# Patient Record
Sex: Male | Born: 1965 | Race: White | Hispanic: No | Marital: Married | Smoking: Never smoker
Health system: Southern US, Community
[De-identification: ages and names within clinical notes are randomized; demographics above are authoritative.]

## PROBLEM LIST (undated history)

## (undated) DIAGNOSIS — I2699 Other pulmonary embolism without acute cor pulmonale: Secondary | ICD-10-CM

## (undated) DIAGNOSIS — I82409 Acute embolism and thrombosis of unspecified deep veins of unspecified lower extremity: Secondary | ICD-10-CM

## (undated) DIAGNOSIS — B2790 Infectious mononucleosis, unspecified without complication: Secondary | ICD-10-CM

## (undated) DIAGNOSIS — I10 Essential (primary) hypertension: Secondary | ICD-10-CM

## (undated) HISTORY — DX: Acute embolism and thrombosis of unspecified deep veins of unspecified lower extremity: I82.409

## (undated) HISTORY — PX: TONSILLECTOMY: SUR1361

## (undated) HISTORY — DX: Infectious mononucleosis, unspecified without complication: B27.90

## (undated) HISTORY — DX: Other pulmonary embolism without acute cor pulmonale: I26.99

---
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
## 1997-01-24 ENCOUNTER — Encounter: Payer: Self-pay | Admitting: Cardiovascular Disease

## 2000-02-29 ENCOUNTER — Encounter: Payer: Self-pay | Admitting: Cardiovascular Disease

## 2001-05-23 ENCOUNTER — Encounter: Admission: RE | Admit: 2001-05-23 | Discharge: 2001-05-23 | Payer: Self-pay | Admitting: Family Medicine

## 2001-05-23 ENCOUNTER — Encounter: Payer: Self-pay | Admitting: Family Medicine

## 2008-04-14 ENCOUNTER — Encounter: Payer: Self-pay | Admitting: Cardiovascular Disease

## 2009-01-01 ENCOUNTER — Encounter: Admission: RE | Admit: 2009-01-01 | Discharge: 2009-01-01 | Payer: Self-pay | Admitting: Family Medicine

## 2010-01-31 IMAGING — CR DG CHEST 2V
2 series · 2 of 2 positions shown · non-contrast
Comparison: None

CLINICAL DATA: Chest pain

CHEST - 2 VIEW

[w chest pa]
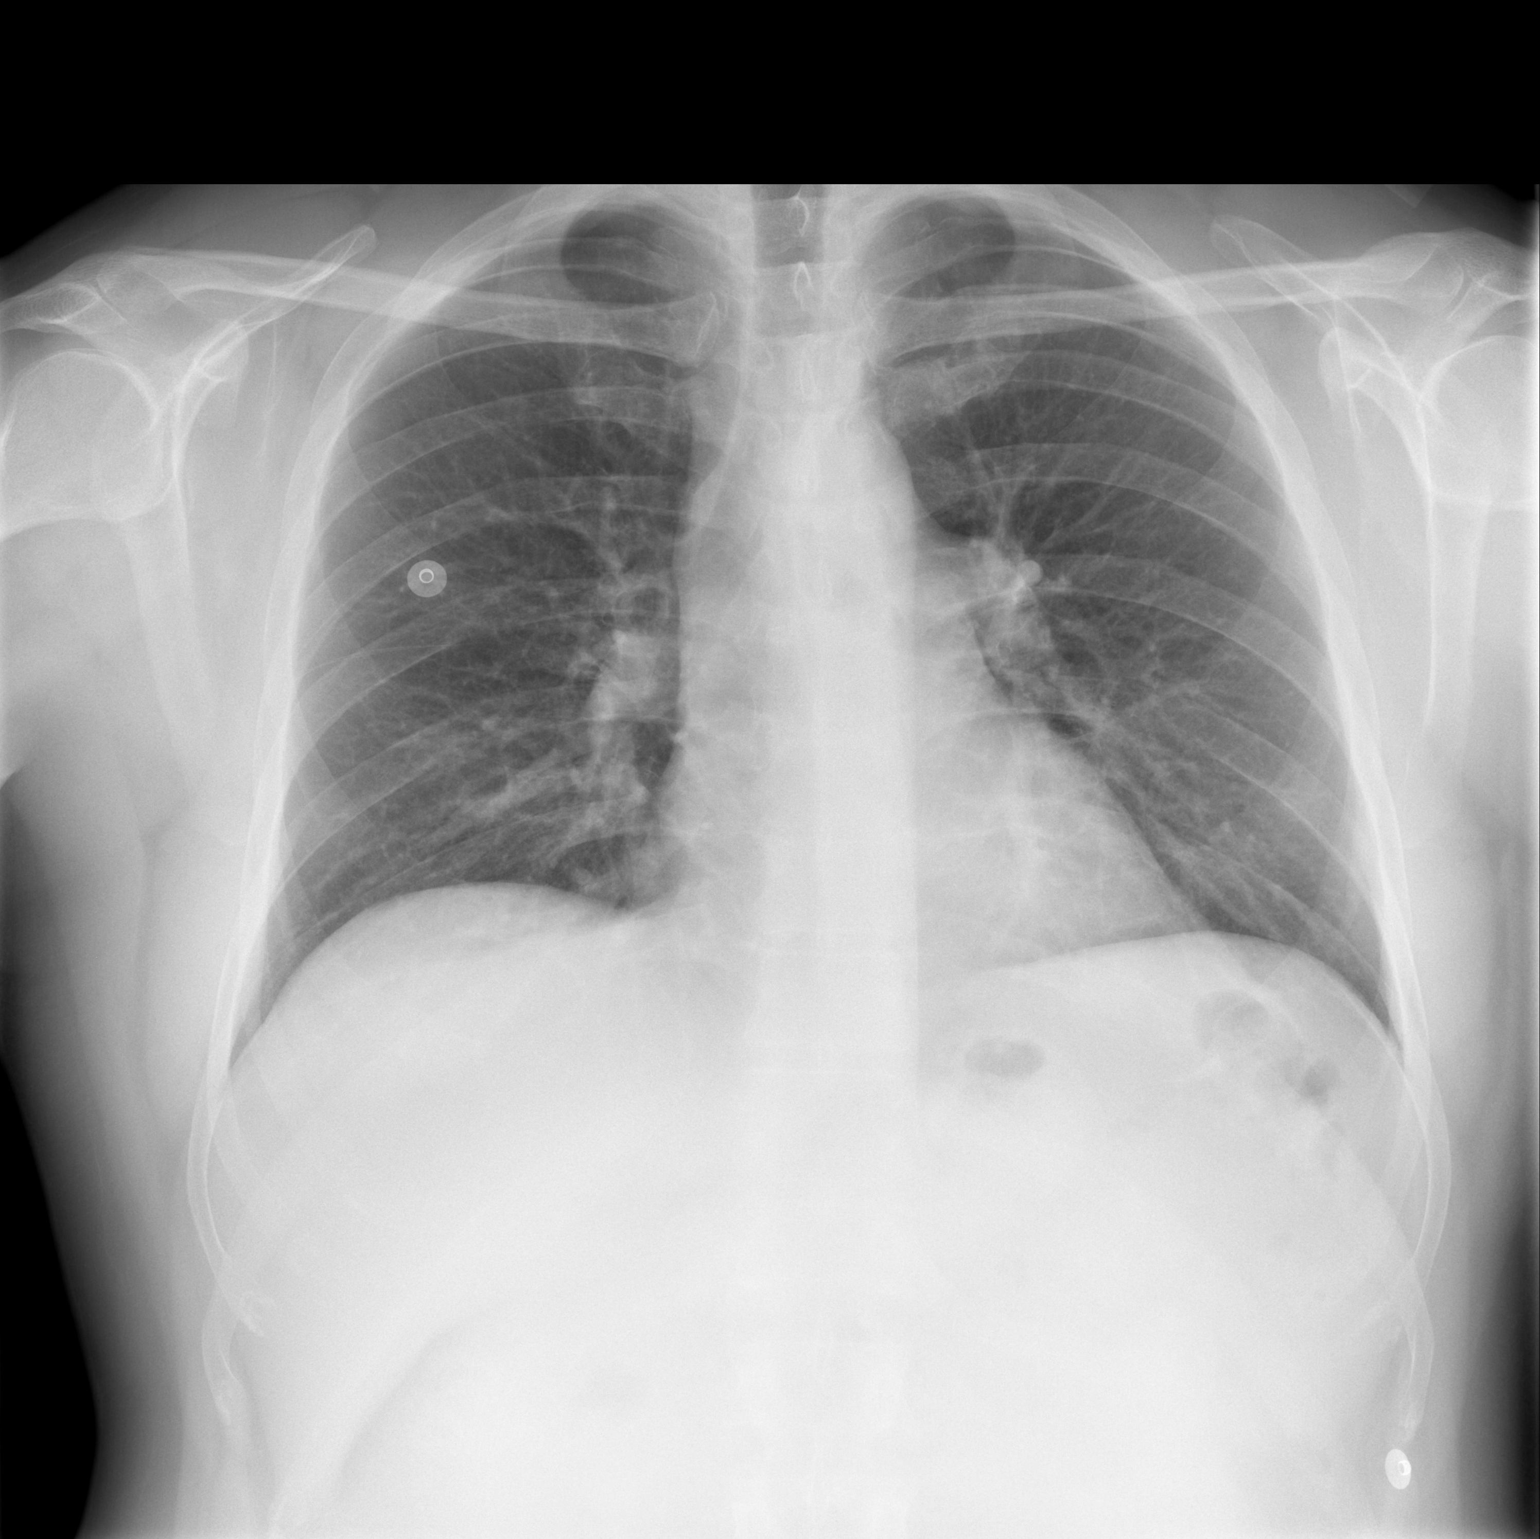

[w chest lat]
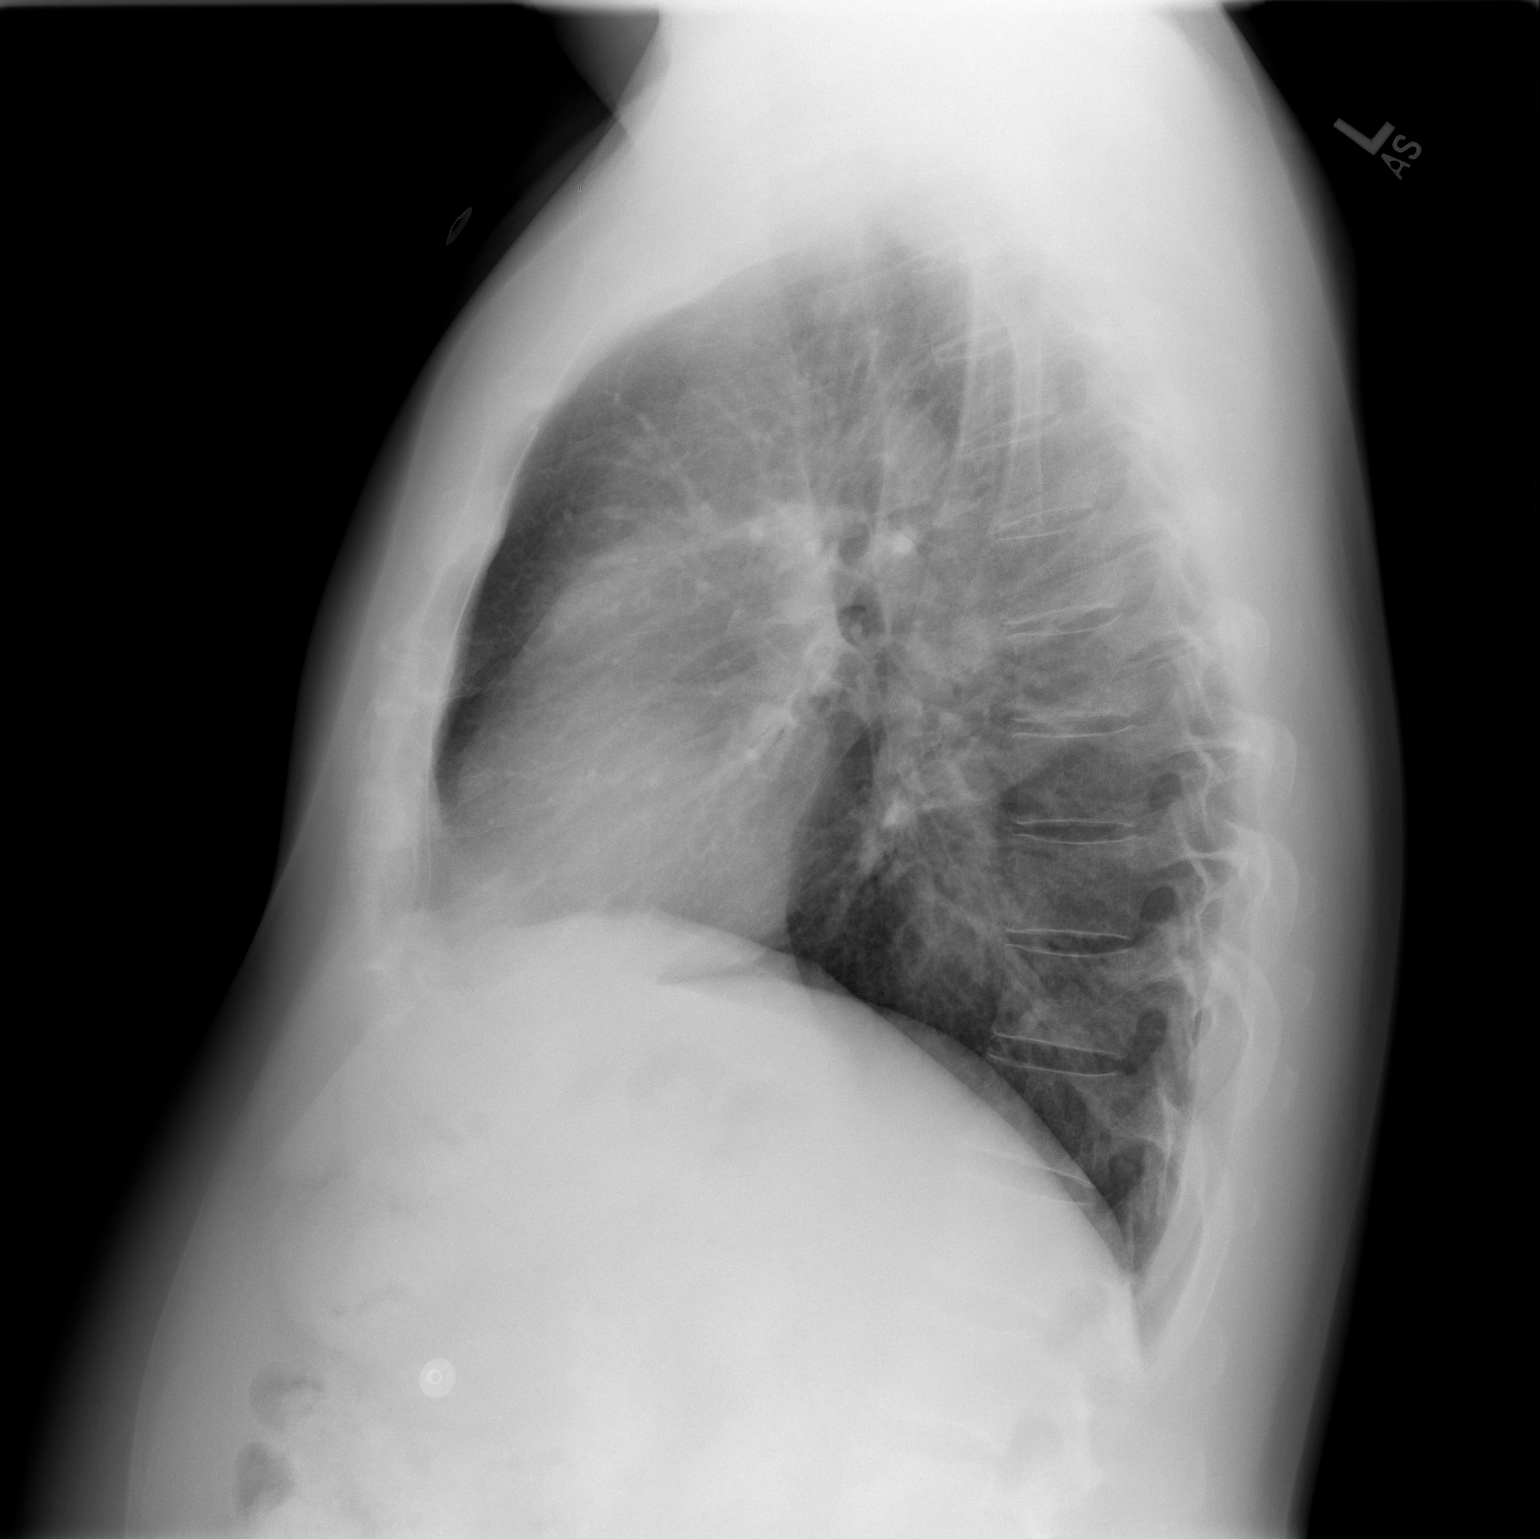

[2 of 2 positions shown; findings below may reference images not displayed]

FINDINGS: Lungs clear.  Heart size and pulmonary vascularity
normal.  No effusion.  Visualized bones unremarkable.
IMPRESSION: No acute disease

## 2012-04-13 ENCOUNTER — Encounter: Payer: Self-pay | Admitting: *Deleted

## 2016-03-08 DIAGNOSIS — Z0000 Encounter for general adult medical examination without abnormal findings: Secondary | ICD-10-CM | POA: Diagnosis not present

## 2016-03-08 DIAGNOSIS — Z125 Encounter for screening for malignant neoplasm of prostate: Secondary | ICD-10-CM | POA: Diagnosis not present

## 2016-03-08 DIAGNOSIS — E7800 Pure hypercholesterolemia, unspecified: Secondary | ICD-10-CM | POA: Diagnosis not present

## 2016-04-12 DIAGNOSIS — K080 Exfoliation of teeth due to systemic causes: Secondary | ICD-10-CM | POA: Diagnosis not present

## 2016-04-25 DIAGNOSIS — J069 Acute upper respiratory infection, unspecified: Secondary | ICD-10-CM | POA: Diagnosis not present

## 2016-09-12 DIAGNOSIS — Z23 Encounter for immunization: Secondary | ICD-10-CM | POA: Diagnosis not present

## 2016-09-12 DIAGNOSIS — E7800 Pure hypercholesterolemia, unspecified: Secondary | ICD-10-CM | POA: Diagnosis not present

## 2016-09-12 DIAGNOSIS — I10 Essential (primary) hypertension: Secondary | ICD-10-CM | POA: Diagnosis not present

## 2016-10-18 DIAGNOSIS — K080 Exfoliation of teeth due to systemic causes: Secondary | ICD-10-CM | POA: Diagnosis not present

## 2016-11-18 DIAGNOSIS — K080 Exfoliation of teeth due to systemic causes: Secondary | ICD-10-CM | POA: Diagnosis not present

## 2017-02-23 DIAGNOSIS — J069 Acute upper respiratory infection, unspecified: Secondary | ICD-10-CM | POA: Diagnosis not present

## 2017-07-24 DIAGNOSIS — I10 Essential (primary) hypertension: Secondary | ICD-10-CM | POA: Diagnosis not present

## 2017-07-24 DIAGNOSIS — Z0000 Encounter for general adult medical examination without abnormal findings: Secondary | ICD-10-CM | POA: Diagnosis not present

## 2017-07-24 DIAGNOSIS — Z125 Encounter for screening for malignant neoplasm of prostate: Secondary | ICD-10-CM | POA: Diagnosis not present

## 2017-07-24 DIAGNOSIS — E7800 Pure hypercholesterolemia, unspecified: Secondary | ICD-10-CM | POA: Diagnosis not present

## 2017-09-19 DIAGNOSIS — K080 Exfoliation of teeth due to systemic causes: Secondary | ICD-10-CM | POA: Diagnosis not present

## 2017-10-24 DIAGNOSIS — K080 Exfoliation of teeth due to systemic causes: Secondary | ICD-10-CM | POA: Diagnosis not present

## 2018-03-27 DIAGNOSIS — K080 Exfoliation of teeth due to systemic causes: Secondary | ICD-10-CM | POA: Diagnosis not present

## 2018-06-20 DIAGNOSIS — I10 Essential (primary) hypertension: Secondary | ICD-10-CM | POA: Diagnosis not present

## 2018-06-20 DIAGNOSIS — Z1159 Encounter for screening for other viral diseases: Secondary | ICD-10-CM | POA: Diagnosis not present

## 2018-06-20 DIAGNOSIS — E7800 Pure hypercholesterolemia, unspecified: Secondary | ICD-10-CM | POA: Diagnosis not present

## 2018-06-20 DIAGNOSIS — R0789 Other chest pain: Secondary | ICD-10-CM | POA: Diagnosis not present

## 2018-08-05 ENCOUNTER — Emergency Department (HOSPITAL_COMMUNITY): Payer: Federal, State, Local not specified - PPO

## 2018-08-05 ENCOUNTER — Emergency Department (HOSPITAL_COMMUNITY)
Admission: EM | Admit: 2018-08-05 | Discharge: 2018-08-05 | Disposition: A | Discharge status: Home / Self Care | Payer: Federal, State, Local not specified - PPO | Attending: Emergency Medicine | Admitting: Emergency Medicine

## 2018-08-05 ENCOUNTER — Encounter (HOSPITAL_COMMUNITY): Payer: Self-pay | Admitting: Emergency Medicine

## 2018-08-05 DIAGNOSIS — Z86718 Personal history of other venous thrombosis and embolism: Secondary | ICD-10-CM | POA: Diagnosis not present

## 2018-08-05 DIAGNOSIS — Z79899 Other long term (current) drug therapy: Secondary | ICD-10-CM | POA: Insufficient documentation

## 2018-08-05 DIAGNOSIS — I10 Essential (primary) hypertension: Secondary | ICD-10-CM | POA: Insufficient documentation

## 2018-08-05 DIAGNOSIS — Z7982 Long term (current) use of aspirin: Secondary | ICD-10-CM | POA: Diagnosis not present

## 2018-08-05 DIAGNOSIS — N23 Unspecified renal colic: Secondary | ICD-10-CM | POA: Diagnosis not present

## 2018-08-05 DIAGNOSIS — N201 Calculus of ureter: Secondary | ICD-10-CM

## 2018-08-05 DIAGNOSIS — R109 Unspecified abdominal pain: Secondary | ICD-10-CM | POA: Diagnosis not present

## 2018-08-05 HISTORY — DX: Essential (primary) hypertension: I10

## 2018-08-05 LAB — URINALYSIS, ROUTINE W REFLEX MICROSCOPIC
BACTERIA UA: NONE SEEN
BILIRUBIN URINE: NEGATIVE
Glucose, UA: NEGATIVE mg/dL
Ketones, ur: NEGATIVE mg/dL
Leukocytes, UA: NEGATIVE
NITRITE: NEGATIVE
PROTEIN: NEGATIVE mg/dL
SPECIFIC GRAVITY, URINE: 1.005 (ref 1.005–1.030)
pH: 6 (ref 5.0–8.0)

## 2018-08-05 LAB — CBC WITH DIFFERENTIAL/PLATELET
Abs Immature Granulocytes: 0 10*3/uL (ref 0.0–0.1)
Basophils Absolute: 0 10*3/uL (ref 0.0–0.1)
Basophils Relative: 1 %
EOS PCT: 4 %
Eosinophils Absolute: 0.2 10*3/uL (ref 0.0–0.7)
HEMATOCRIT: 47.9 % (ref 39.0–52.0)
HEMOGLOBIN: 16.2 g/dL (ref 13.0–17.0)
Immature Granulocytes: 1 %
LYMPHS ABS: 2.8 10*3/uL (ref 0.7–4.0)
LYMPHS PCT: 42 %
MCH: 30.7 pg (ref 26.0–34.0)
MCHC: 33.8 g/dL (ref 30.0–36.0)
MCV: 90.7 fL (ref 78.0–100.0)
MONO ABS: 0.6 10*3/uL (ref 0.1–1.0)
MONOS PCT: 10 %
Neutro Abs: 2.9 10*3/uL (ref 1.7–7.7)
Neutrophils Relative %: 44 %
Platelets: 240 10*3/uL (ref 150–400)
RBC: 5.28 MIL/uL (ref 4.22–5.81)
RDW: 12.2 % (ref 11.5–15.5)
WBC: 6.7 10*3/uL (ref 4.0–10.5)

## 2018-08-05 LAB — COMPREHENSIVE METABOLIC PANEL
ALBUMIN: 4.1 g/dL (ref 3.5–5.0)
ALT: 21 U/L (ref 0–44)
ANION GAP: 10 (ref 5–15)
AST: 23 U/L (ref 15–41)
Alkaline Phosphatase: 82 U/L (ref 38–126)
BILIRUBIN TOTAL: 1.6 mg/dL — AB (ref 0.3–1.2)
BUN: 9 mg/dL (ref 6–20)
CHLORIDE: 102 mmol/L (ref 98–111)
CO2: 25 mmol/L (ref 22–32)
Calcium: 9 mg/dL (ref 8.9–10.3)
Creatinine, Ser: 1.14 mg/dL (ref 0.61–1.24)
GFR calc Af Amer: >60 mL/min (ref >60)
GFR calc non Af Amer: >60 mL/min (ref >60)
GLUCOSE: 109 mg/dL — AB (ref 70–99)
POTASSIUM: 3.9 mmol/L (ref 3.5–5.1)
SODIUM: 137 mmol/L (ref 135–145)
Total Protein: 6.9 g/dL (ref 6.5–8.1)

## 2018-08-05 MED ORDER — ONDANSETRON 4 MG PO TBDP: 4.0000 mg | ORAL_TABLET | Freq: Three times a day (TID) | ORAL | 0 refills | Status: AC | PRN

## 2018-08-05 MED ORDER — SODIUM CHLORIDE 0.9 % IV BOLUS
1000.0000 mL | Freq: Once | INTRAVENOUS | Status: AC
  Administered 2018-08-05: 1000 mL via INTRAVENOUS

## 2018-08-05 MED ORDER — ONDANSETRON HCL 4 MG/2ML IJ SOLN
4.0000 mg | Freq: Once | INTRAMUSCULAR | Status: AC
  Administered 2018-08-05: 4 mg via INTRAVENOUS
  Filled 2018-08-05: qty 2

## 2018-08-05 MED ORDER — HYDROCODONE-ACETAMINOPHEN 5-325 MG PO TABS: 1.0000 | ORAL_TABLET | Freq: Four times a day (QID) | ORAL | 0 refills | Status: AC | PRN

## 2018-08-05 MED ORDER — KETOROLAC TROMETHAMINE 30 MG/ML IJ SOLN
15.0000 mg | Freq: Once | INTRAMUSCULAR | Status: AC
  Administered 2018-08-05: 15 mg via INTRAVENOUS
  Filled 2018-08-05: qty 1

## 2018-08-05 MED ORDER — TAMSULOSIN HCL 0.4 MG PO CAPS: 0.4000 mg | ORAL_CAPSULE | Freq: Every day | ORAL | 0 refills | Status: AC

## 2018-08-05 NOTE — ED Triage Notes (Signed)
Pt states he was sitting in bed last night and had sudden onset right flank pain. The pain radiates down to the right LQ abdomen. Denies urinary symptoms.

## 2018-08-05 NOTE — ED Notes (Signed)
Patient transported to CT 

## 2018-08-05 NOTE — ED Notes (Signed)
Patient ambulatory to bathroom with steady gait at this time 

## 2018-08-05 NOTE — Discharge Instructions (Signed)
°  Kidney Stone There is evidence of a kidney stone on the right side.  It appears as though it is on its way out.  Some kidney stones can take up to 30 days to pass. Hydration: Hydration is key to helping a kidney stone pass.  Have a goal of half a liter of water every hour or two. Antiinflammatory medications: Take 600 mg of ibuprofen every 6 hours or 440 mg (over the counter dose) to 500 mg (prescription dose) of naproxen every 12 hours for the next 3 days. After this time, these medications may be used as needed for pain. Take these medications with food to avoid upset stomach. Choose only one of these medications, do not take them together. Tylenol: Should you continue to have additional pain while taking the ibuprofen or naproxen, you may add in tylenol as needed. Your daily total maximum amount of tylenol from all sources should be limited to 4000mg /day for persons without liver problems, or 2000mg /day for those with liver problems. Vicodin: May take Vicodin (hydrocodone-acetaminophen) as needed for severe pain.  Do not drive or perform other dangerous activities while taking the Vicodin.  Please note that each pill of Vicodin contains 325 mg of acetaminophen (Tylenol) and the above dosage limits apply. Tamsulosin: This medication is designed to help the stone pass.  Take this medication daily until stone passes. Zofran: Use of Zofran, as needed, for nausea/vomiting. Follow-up: Follow-up with the urologist as soon as possible on this matter.  Please also note there was an incidental finding of a nodule located in the right lower pelvis.  Recommendation is for repeat CT of the abdomen/pelvis in 3 months.  This may be set up through a primary care provider or urology.

## 2018-08-05 NOTE — ED Notes (Signed)
Patient verbalizes understanding of discharge instructions. Opportunity for questioning and answers were provided. Armband removed by staff, pt discharged from ED.  

## 2018-08-05 NOTE — ED Provider Notes (Signed)
[facility] EMERGENCY DEPARTMENT Provider Note   CSN: [id number] Arrival date & time: 08/05/18  1027     History   Chief Complaint Chief Complaint  Patient presents with  . Flank Pain    HPI Bob Hendrix is a 52 y.o. male.  HPI   RESHARD KITTELSON is a 52 y.o. male, with a history of DVT, HTN, and PE, presenting to the ED with right lower back and flank pain beginning around 8 AM this morning.  Pain feels like a squeezing, waxes and wanes between 5/10 to 7/10, radiating around the right flank into the right lower quadrant.  He has not had this pain before.  Accompanied by nausea.  Denies fever, vomiting, diarrhea, hematochezia/melena, other abdominal pain, genital pain, hematuria, dysuria, redness of breath, chest pain, numbness/weakness in the extremities, pain/swelling in the extremities, or any other complaints.    Past Medical History:  Diagnosis Date  . Deep venous thrombosis (HCC)   . Hypertension   . Mononucleosis    1983  . Pulmonary embolus (HCC)     There are no active problems to display for this patient.   Past Surgical History:  Procedure Laterality Date  . arm surgery  1984   Right arm  . TONSILLECTOMY     at age 22        Home Medications    Prior to Admission medications   Medication Sig Start Date End Date Taking? Authorizing Provider  aspirin 81 MG tablet Take 81 mg by mouth daily.    [provider]  HYDROcodone-acetaminophen (NORCO/VICODIN) 5-325 MG tablet Take 1-2 tablets by mouth every 6 (six) hours as needed for severe pain. 08/05/18   Joy, Shawn C, PA-C  lisinopril (PRINIVIL,ZESTRIL) 10 MG tablet Take 10 mg by mouth daily.    [provider]  metoprolol succinate (TOPROL-XL) 50 MG 24 hr tablet Take 50 mg by mouth daily. Take with or immediately following a meal.    [provider]  ondansetron (ZOFRAN ODT) 4 MG disintegrating tablet Take 1 tablet (4 mg total) by mouth every 8 (eight) hours  as needed for nausea or vomiting. 08/05/18   Joy, Shawn C, PA-C  simvastatin (ZOCOR) 40 MG tablet Take 40 mg by mouth every evening.    [provider]  tamsulosin (FLOMAX) 0.4 MG CAPS capsule Take 1 capsule (0.4 mg total) by mouth daily. 08/05/18   Joy, Hillard Danker, PA-C    Family History Family History  Problem Relation Age of Onset  . Hypertension Mother     Social History Social History   Tobacco Use  . Smoking status: Never Smoker  Substance Use Topics  . Alcohol use: No  . Drug use: Not on file     Allergies   Compazine [prochlorperazine edisylate]; Nizoral [ketoconazole]; and Penicillins   Review of Systems Review of Systems  Constitutional: Negative for chills, diaphoresis and fever.  Respiratory: Negative for shortness of breath.   Cardiovascular: Negative for chest pain.  Gastrointestinal: Positive for nausea. Negative for blood in stool, diarrhea and vomiting.  Genitourinary: Positive for flank pain. Negative for difficulty urinating, dysuria, hematuria, penile swelling, scrotal swelling and testicular pain.  Musculoskeletal: Positive for back pain.  Neurological: Negative for weakness and numbness.  All other systems reviewed and are negative.    Physical Exam Updated Vital Signs BP (!) 157/80   Pulse 61   Temp 98.6 F (37 C)   Resp 18   SpO2 100%  Physical Exam  Constitutional: He appears well-developed and well-nourished. No distress.  HENT:  Head: Normocephalic and atraumatic.  Eyes: Conjunctivae are normal.  Neck: Neck supple.  Cardiovascular: Normal rate, regular rhythm, normal heart sounds and intact distal pulses.  Pulmonary/Chest: Effort normal and breath sounds normal. No respiratory distress.  Abdominal: Soft. There is no tenderness. There is no guarding.  Musculoskeletal: He exhibits no edema.  Lymphadenopathy:    He has no cervical adenopathy.  Neurological: He is alert.  Skin: Skin is warm and dry. He is not diaphoretic.    Psychiatric: He has a normal mood and affect. His behavior is normal.  Nursing note and vitals reviewed.    ED Treatments / Results  Labs (all labs ordered are listed, but only abnormal results are displayed) Labs Reviewed  COMPREHENSIVE METABOLIC PANEL - Abnormal; Notable for the following components:      Result Value   Glucose, Bld 109 (*)    Total Bilirubin 1.6 (*)    All other components within normal limits  URINALYSIS, ROUTINE W REFLEX MICROSCOPIC - Abnormal; Notable for the following components:   Hgb urine dipstick MODERATE (*)    All other components within normal limits  CBC WITH DIFFERENTIAL/PLATELET    EKG None  Radiology Ct Renal Stone Study  Result Date: 08/05/2018 CLINICAL DATA:  Onset right flank pain radiating to the right lower quadrant this morning. EXAM: CT ABDOMEN AND PELVIS WITHOUT CONTRAST TECHNIQUE: Multidetector CT imaging of the abdomen and pelvis was performed following the standard protocol without IV contrast. COMPARISON:  None. FINDINGS: Lower chest: Mild dependent atelectasis is present in the lung bases. No pleural or pericardial effusion. Hepatobiliary: The gallbladder is decompressed but otherwise unremarkable. 1.2 cm in diameter hypoattenuating lesion in the right hepatic lobe is compatible with a cyst or hemangioma. The liver otherwise appears normal. Pancreas: Unremarkable. No pancreatic ductal dilatation or surrounding inflammatory changes. Spleen: Normal in size without focal abnormality. Adrenals/Urinary Tract: There is mild right hydronephrosis due to a punctate 1-2 mm right ureteral stone just proximal to the ureterovesical junction. No other urinary tract stones are identified. Urinary bladder appears normal. The adrenal glands are normal in appearance. Stomach/Bowel: Sigmoid diverticulosis without diverticulitis is identified. The colon otherwise appears normal. The stomach, small bowel and appendix appear normal. Vascular/Lymphatic: Aortic  atherosclerosis. No enlarged abdominal or pelvic lymph nodes. Reproductive: Prostate is unremarkable. Other: Small fat containing umbilical and bilateral inguinal hernias are identified. There is a 3.6 x 2.5 cm nodular lesion adjacent to the right seminal vesicle. Musculoskeletal: No acute or focal bony abnormality. IMPRESSION: Mild right hydronephrosis due to a 1-2 mm stone just proximal to the UVJ. Negative for other urinary tract stones or acute abnormality. Soft tissue nodule in the right pelvis is likely a lymph node. This is nonspecific and could be incidental but follow-up abdomen and pelvis CT scan in 3 months is recommended to ensure stability or resolution. Small fat containing umbilical and bilateral inguinal hernias. Sigmoid diverticulosis without diverticulitis. Atherosclerosis. Electronically Signed   By: Drusilla Kanner M.D.   On: 08/05/2018 09:52    Procedures Procedures (including critical care time)  Medications Ordered in ED Medications  ondansetron (ZOFRAN) injection 4 mg (4 mg Intravenous Given 08/05/18 0906)  sodium chloride 0.9 % bolus 1,000 mL (0 mLs Intravenous Stopped 08/05/18 1009)  ketorolac (TORADOL) 30 MG/ML injection 15 mg (15 mg Intravenous Given 08/05/18 0906)     Initial Impression / Assessment and Plan / ED Course  I have reviewed  the triage vital signs and the nursing notes.  Pertinent labs & imaging results that were available during my care of the patient were reviewed by me and considered in my medical decision making (see chart for details).  Clinical Course as of Aug 06 1055  Wynelle Link Aug 05, 2018  1610 Patient states his symptoms have resolved.   [SJ]  1008 Discussed imaging results with the patient.  Patient continues to be pain-free.   [SJ]    Clinical Course User Index [SJ] Joy, Shawn C, PA-C    Patient presents with right lower back and flank pain.  Pain and onset of symptoms has the pattern of renal colic.  Small stone noted at the right UVJ.   Patient's pain managed early in ED course.  We discussed the incidental finding of the nodule as well as recommended follow-up CT in 3 months. He will follow-up with his PCP and urology. The patient was given instructions for home care as well as return precautions. Patient voices understanding of these instructions, accepts the plan, and is comfortable with discharge.    Final Clinical Impressions(s) / ED Diagnoses   Final diagnoses:  Ureterolithiasis  Ureteral colic    ED Discharge Orders         Ordered    HYDROcodone-acetaminophen (NORCO/VICODIN) 5-325 MG tablet  Every 6 hours PRN     08/05/18 1018    ondansetron (ZOFRAN ODT) 4 MG disintegrating tablet  Every 8 hours PRN     08/05/18 1018    tamsulosin (FLOMAX) 0.4 MG CAPS capsule  Daily     08/05/18 1018           Anselm Pancoast, PA-C 08/05/18 1059    Vanetta Mulders, MD 08/05/18 [phone]

## 2018-08-05 NOTE — ED Notes (Signed)
Pt returned from CT °

## 2018-08-27 DIAGNOSIS — R590 Localized enlarged lymph nodes: Secondary | ICD-10-CM | POA: Diagnosis not present

## 2018-08-27 DIAGNOSIS — Z125 Encounter for screening for malignant neoplasm of prostate: Secondary | ICD-10-CM | POA: Diagnosis not present

## 2018-08-27 DIAGNOSIS — N201 Calculus of ureter: Secondary | ICD-10-CM | POA: Diagnosis not present

## 2018-09-11 DIAGNOSIS — Z125 Encounter for screening for malignant neoplasm of prostate: Secondary | ICD-10-CM | POA: Diagnosis not present

## 2018-10-08 DIAGNOSIS — N201 Calculus of ureter: Secondary | ICD-10-CM | POA: Diagnosis not present

## 2018-10-11 DIAGNOSIS — R599 Enlarged lymph nodes, unspecified: Secondary | ICD-10-CM | POA: Diagnosis not present

## 2018-10-11 DIAGNOSIS — N201 Calculus of ureter: Secondary | ICD-10-CM | POA: Diagnosis not present

## 2018-12-03 DIAGNOSIS — K080 Exfoliation of teeth due to systemic causes: Secondary | ICD-10-CM | POA: Diagnosis not present

## 2018-12-18 DIAGNOSIS — Z125 Encounter for screening for malignant neoplasm of prostate: Secondary | ICD-10-CM | POA: Diagnosis not present

## 2018-12-18 DIAGNOSIS — R590 Localized enlarged lymph nodes: Secondary | ICD-10-CM | POA: Diagnosis not present

## 2018-12-18 DIAGNOSIS — N201 Calculus of ureter: Secondary | ICD-10-CM | POA: Diagnosis not present

## 2018-12-21 DIAGNOSIS — I10 Essential (primary) hypertension: Secondary | ICD-10-CM | POA: Diagnosis not present

## 2018-12-21 DIAGNOSIS — E7800 Pure hypercholesterolemia, unspecified: Secondary | ICD-10-CM | POA: Diagnosis not present

## 2019-01-07 DIAGNOSIS — M1711 Unilateral primary osteoarthritis, right knee: Secondary | ICD-10-CM | POA: Diagnosis not present

## 2019-07-15 DIAGNOSIS — Z0000 Encounter for general adult medical examination without abnormal findings: Secondary | ICD-10-CM | POA: Diagnosis not present

## 2019-07-22 DIAGNOSIS — E7800 Pure hypercholesterolemia, unspecified: Secondary | ICD-10-CM | POA: Diagnosis not present

## 2019-07-22 DIAGNOSIS — Z125 Encounter for screening for malignant neoplasm of prostate: Secondary | ICD-10-CM | POA: Diagnosis not present

## 2019-09-02 DIAGNOSIS — M1711 Unilateral primary osteoarthritis, right knee: Secondary | ICD-10-CM | POA: Diagnosis not present

## 2019-09-04 DIAGNOSIS — M25561 Pain in right knee: Secondary | ICD-10-CM | POA: Diagnosis not present

## 2019-09-04 IMAGING — CT CT RENAL STONE PROTOCOL
2 of 4 series · 16 of 46 positions shown, 18 images · non-contrast
Comparison: None.

CLINICAL DATA: Onset right flank pain radiating to the right lower
quadrant this morning.

EXAM:
CT ABDOMEN AND PELVIS WITHOUT CONTRAST
TECHNIQUE: Multidetector CT imaging of the abdomen and pelvis was performed
following the standard protocol without IV contrast.

[Series 3: ap without · axial · non-contrast · 0.79mm/px · z∈[-1058,-628]mm · 13 of 98 slices shown, 15 images]
[im 6/98  soft-tissue]
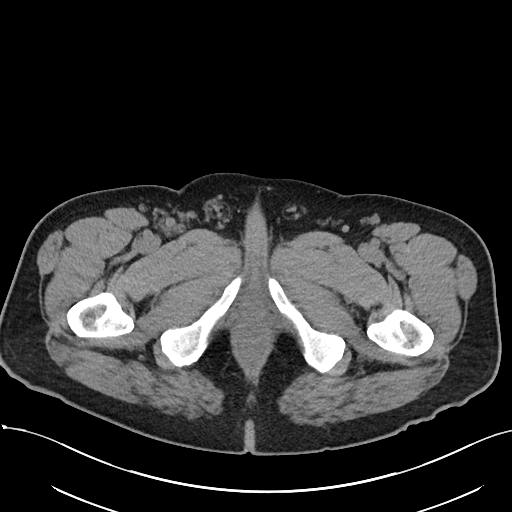
[im 6/98  bone]
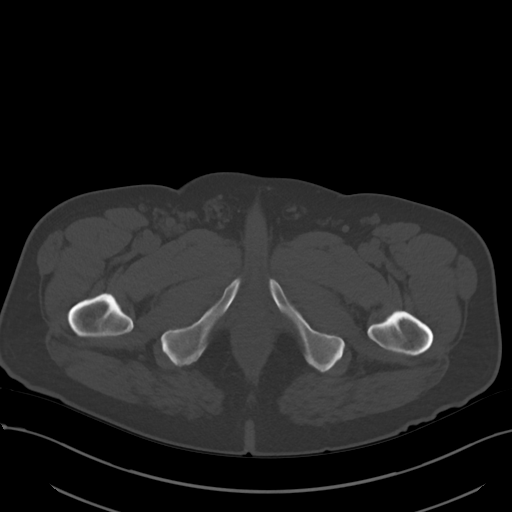
[im 11/98  soft-tissue]
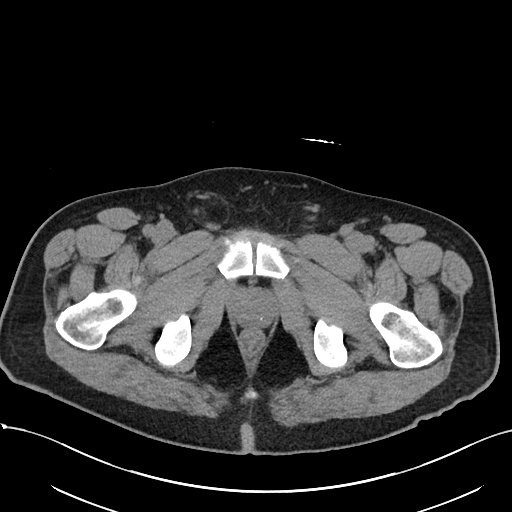
[im 22/98  soft-tissue]
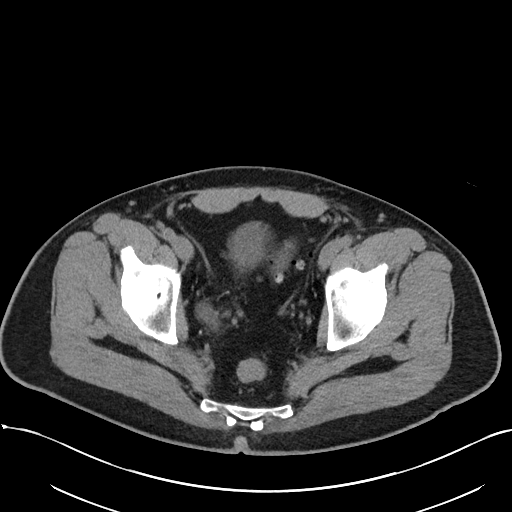
[im 27/98  soft-tissue]
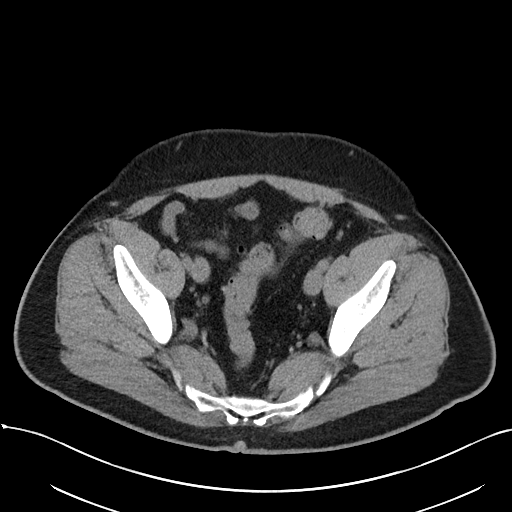
[im 33/98  soft-tissue]
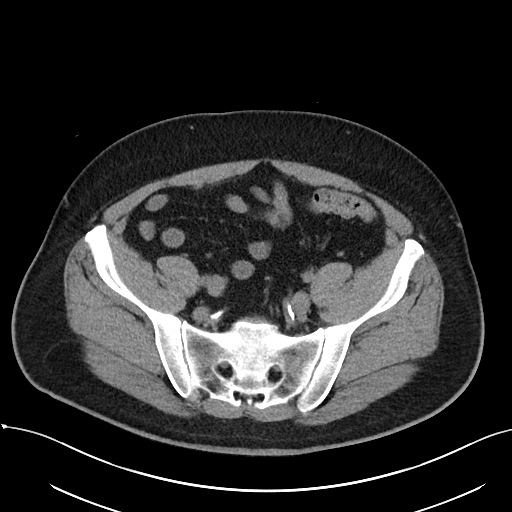
[im 44/98  soft-tissue]
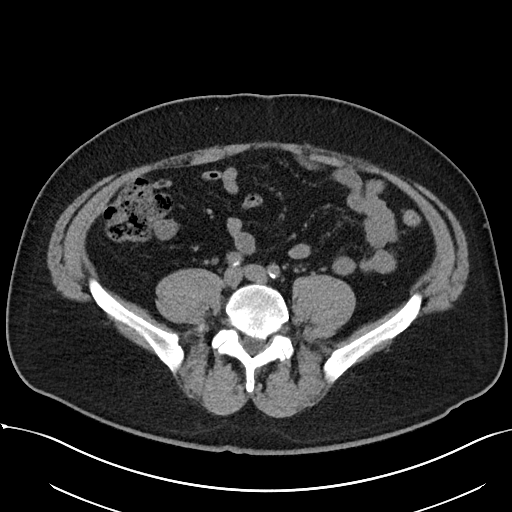
[im 49/98  soft-tissue]
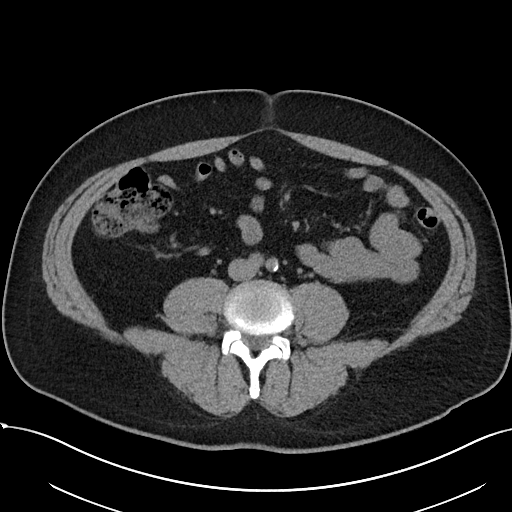
[im 54/98  soft-tissue]
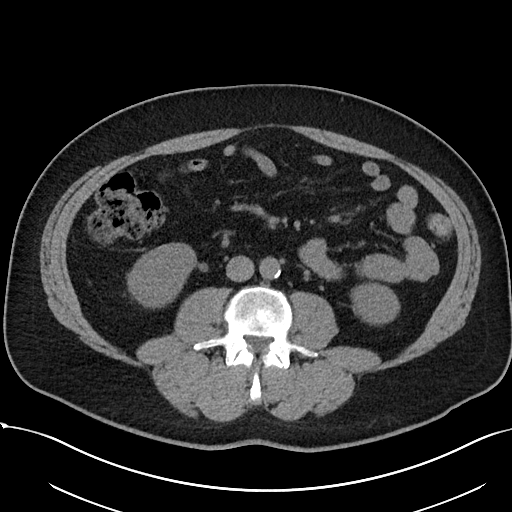
[im 65/98  soft-tissue]
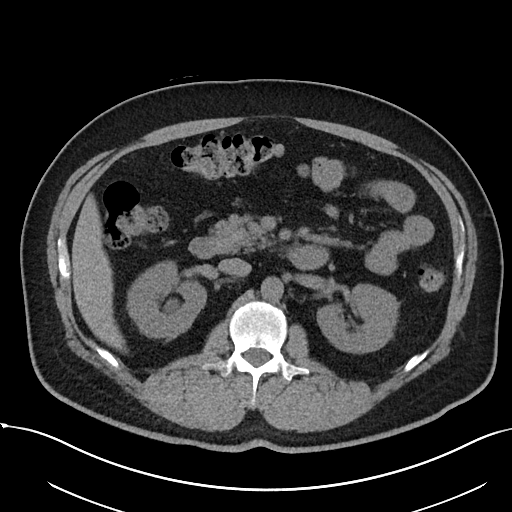
[im 65/98  bone]
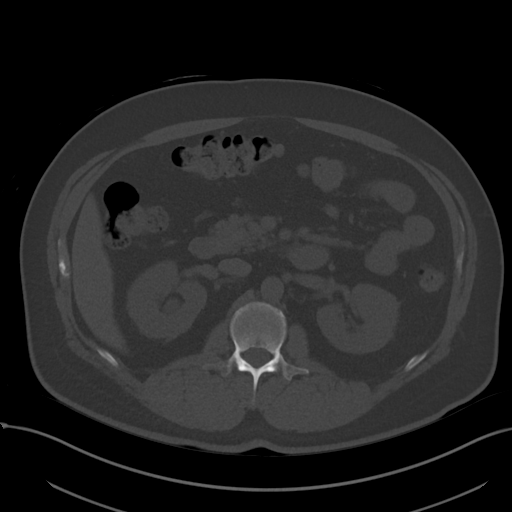
[im 71/98  soft-tissue]
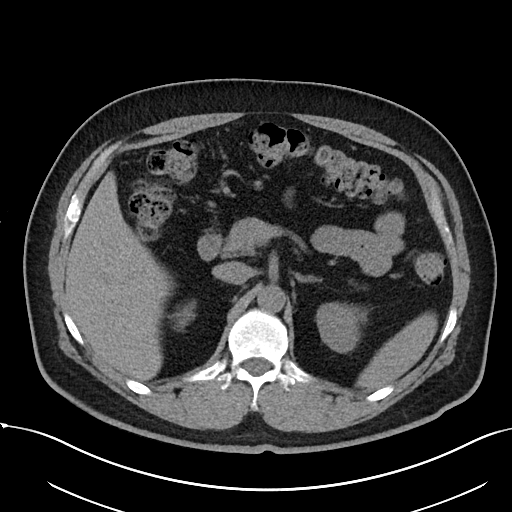
[im 76/98  soft-tissue]
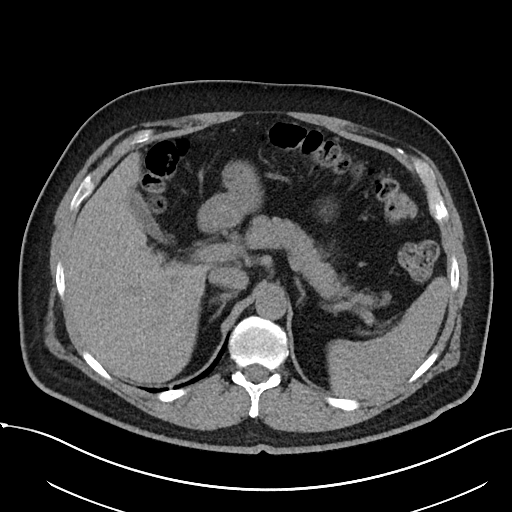
[im 87/98  soft-tissue]
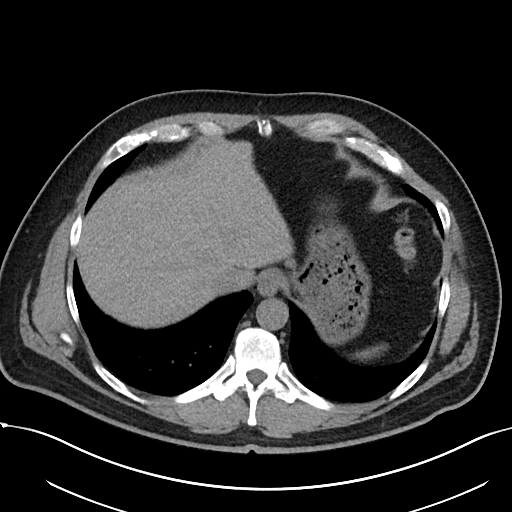
[im 92/98  soft-tissue]
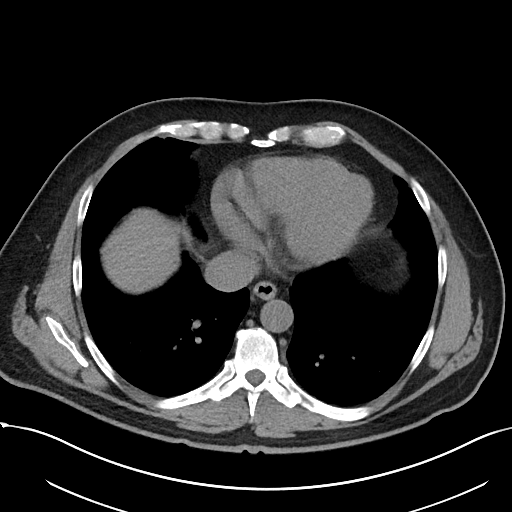

[Series 6: cor · coronal · 0.80mm/px · 3 of 82 slices shown]
[im 28/82  soft-tissue]
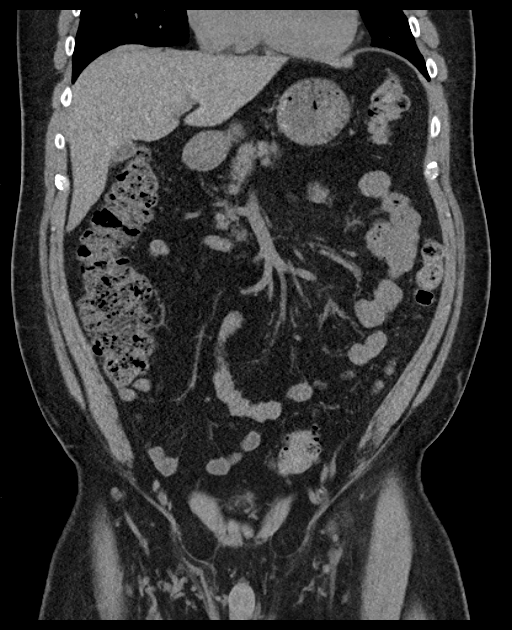
[im 37/82  soft-tissue]
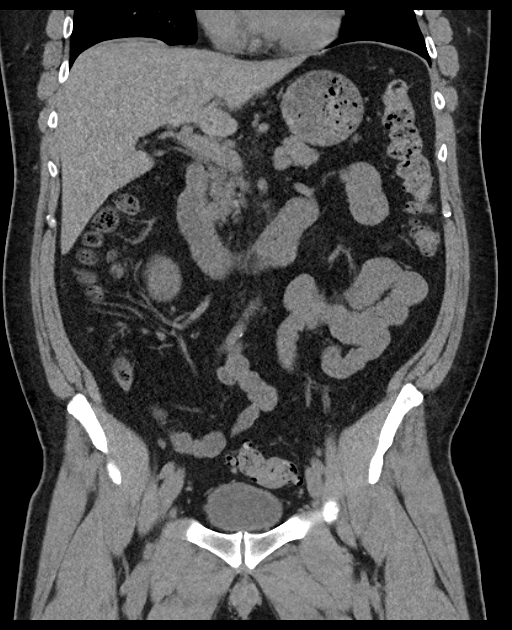
[im 46/82  soft-tissue]
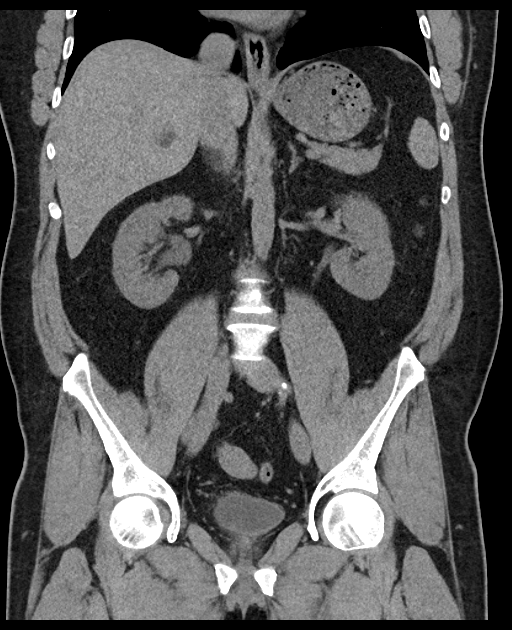

[16 of 46 positions shown; findings below may reference images not displayed]

FINDINGS: Lower chest: Mild dependent atelectasis is present in the lung
bases. No pleural or pericardial effusion.

Hepatobiliary: The gallbladder is decompressed but otherwise
unremarkable. 1.2 cm in diameter hypoattenuating lesion in the right
hepatic lobe is compatible with a cyst or hemangioma. The liver
otherwise appears normal.

Pancreas: Unremarkable. No pancreatic ductal dilatation or
surrounding inflammatory changes.

Spleen: Normal in size without focal abnormality.

Adrenals/Urinary Tract: There is mild right hydronephrosis due to a
punctate 1-2 mm right ureteral stone just proximal to the
ureterovesical junction. No other urinary tract stones are
identified. Urinary bladder appears normal. The adrenal glands are
normal in appearance.

Stomach/Bowel: Sigmoid diverticulosis without diverticulitis is
identified. The colon otherwise appears normal. The stomach, small
bowel and appendix appear normal.

Vascular/Lymphatic: Aortic atherosclerosis. No enlarged abdominal or
pelvic lymph nodes.

Reproductive: Prostate is unremarkable.

Other: Small fat containing umbilical and bilateral inguinal hernias
are identified. There is a 3.6 x 2.5 cm nodular lesion adjacent to
the right seminal vesicle.

Musculoskeletal: No acute or focal bony abnormality.
IMPRESSION: Mild right hydronephrosis due to a 1-2 mm stone just proximal to the
UVJ. Negative for other urinary tract stones or acute abnormality.

Soft tissue nodule in the right pelvis is likely a lymph node. This
is nonspecific and could be incidental but follow-up abdomen and
pelvis CT scan in 3 months is recommended to ensure stability or
resolution.

Small fat containing umbilical and bilateral inguinal hernias.

Sigmoid diverticulosis without diverticulitis.

Atherosclerosis.

## 2019-09-09 DIAGNOSIS — M25561 Pain in right knee: Secondary | ICD-10-CM | POA: Diagnosis not present

## 2019-09-11 DIAGNOSIS — X58XXXA Exposure to other specified factors, initial encounter: Secondary | ICD-10-CM | POA: Diagnosis not present

## 2019-09-11 DIAGNOSIS — Y999 Unspecified external cause status: Secondary | ICD-10-CM | POA: Diagnosis not present

## 2019-09-11 DIAGNOSIS — M958 Other specified acquired deformities of musculoskeletal system: Secondary | ICD-10-CM | POA: Diagnosis not present

## 2019-09-11 DIAGNOSIS — M1711 Unilateral primary osteoarthritis, right knee: Secondary | ICD-10-CM | POA: Diagnosis not present

## 2019-09-11 DIAGNOSIS — S83241A Other tear of medial meniscus, current injury, right knee, initial encounter: Secondary | ICD-10-CM | POA: Diagnosis not present

## 2019-09-11 DIAGNOSIS — S83231A Complex tear of medial meniscus, current injury, right knee, initial encounter: Secondary | ICD-10-CM | POA: Diagnosis not present

## 2019-09-16 DIAGNOSIS — S83231D Complex tear of medial meniscus, current injury, right knee, subsequent encounter: Secondary | ICD-10-CM | POA: Diagnosis not present

## 2019-09-16 DIAGNOSIS — R262 Difficulty in walking, not elsewhere classified: Secondary | ICD-10-CM | POA: Diagnosis not present

## 2019-09-16 DIAGNOSIS — M6281 Muscle weakness (generalized): Secondary | ICD-10-CM | POA: Diagnosis not present

## 2019-09-16 DIAGNOSIS — M25561 Pain in right knee: Secondary | ICD-10-CM | POA: Diagnosis not present

## 2019-09-19 ENCOUNTER — Other Ambulatory Visit: Payer: Self-pay

## 2019-09-19 ENCOUNTER — Other Ambulatory Visit (HOSPITAL_COMMUNITY): Payer: Self-pay | Admitting: Orthopedic Surgery

## 2019-09-19 ENCOUNTER — Ambulatory Visit (HOSPITAL_COMMUNITY)
Admission: RE | Admit: 2019-09-19 | Discharge: 2019-09-19 | Disposition: A | Discharge status: Home / Self Care | Payer: Federal, State, Local not specified - PPO | Source: Ambulatory Visit | Attending: Family Medicine | Admitting: Family Medicine

## 2019-09-19 DIAGNOSIS — M7989 Other specified soft tissue disorders: Secondary | ICD-10-CM | POA: Insufficient documentation

## 2019-09-19 DIAGNOSIS — M79604 Pain in right leg: Secondary | ICD-10-CM

## 2019-09-19 NOTE — Progress Notes (Signed)
VASCULAR LAB PRELIMINARY  PRELIMINARY  PRELIMINARY  PRELIMINARY  Right lower extremity venous duplex completed.    Preliminary report:  See CV proc for preliminary results.   Left message on Josh's personal voicemail  Adael Culbreath, RVT 09/19/2019, 1:31 PM

## 2019-09-20 DIAGNOSIS — M25561 Pain in right knee: Secondary | ICD-10-CM | POA: Diagnosis not present

## 2019-09-20 DIAGNOSIS — M6281 Muscle weakness (generalized): Secondary | ICD-10-CM | POA: Diagnosis not present

## 2019-09-20 DIAGNOSIS — S83231D Complex tear of medial meniscus, current injury, right knee, subsequent encounter: Secondary | ICD-10-CM | POA: Diagnosis not present

## 2019-09-20 DIAGNOSIS — R262 Difficulty in walking, not elsewhere classified: Secondary | ICD-10-CM | POA: Diagnosis not present

## 2019-09-24 DIAGNOSIS — M25561 Pain in right knee: Secondary | ICD-10-CM | POA: Diagnosis not present

## 2019-09-24 DIAGNOSIS — R262 Difficulty in walking, not elsewhere classified: Secondary | ICD-10-CM | POA: Diagnosis not present

## 2019-09-24 DIAGNOSIS — M6281 Muscle weakness (generalized): Secondary | ICD-10-CM | POA: Diagnosis not present

## 2019-09-24 DIAGNOSIS — S83231D Complex tear of medial meniscus, current injury, right knee, subsequent encounter: Secondary | ICD-10-CM | POA: Diagnosis not present

## 2019-10-02 DIAGNOSIS — R262 Difficulty in walking, not elsewhere classified: Secondary | ICD-10-CM | POA: Diagnosis not present

## 2019-10-02 DIAGNOSIS — S83231D Complex tear of medial meniscus, current injury, right knee, subsequent encounter: Secondary | ICD-10-CM | POA: Diagnosis not present

## 2019-10-02 DIAGNOSIS — M25561 Pain in right knee: Secondary | ICD-10-CM | POA: Diagnosis not present

## 2019-10-02 DIAGNOSIS — M6281 Muscle weakness (generalized): Secondary | ICD-10-CM | POA: Diagnosis not present

## 2019-10-08 DIAGNOSIS — M25561 Pain in right knee: Secondary | ICD-10-CM | POA: Diagnosis not present

## 2019-10-08 DIAGNOSIS — S83231D Complex tear of medial meniscus, current injury, right knee, subsequent encounter: Secondary | ICD-10-CM | POA: Diagnosis not present

## 2019-10-08 DIAGNOSIS — M6281 Muscle weakness (generalized): Secondary | ICD-10-CM | POA: Diagnosis not present

## 2019-10-08 DIAGNOSIS — R262 Difficulty in walking, not elsewhere classified: Secondary | ICD-10-CM | POA: Diagnosis not present

## 2019-10-16 DIAGNOSIS — S83231D Complex tear of medial meniscus, current injury, right knee, subsequent encounter: Secondary | ICD-10-CM | POA: Diagnosis not present

## 2019-10-16 DIAGNOSIS — M6281 Muscle weakness (generalized): Secondary | ICD-10-CM | POA: Diagnosis not present

## 2019-10-16 DIAGNOSIS — M25561 Pain in right knee: Secondary | ICD-10-CM | POA: Diagnosis not present

## 2019-10-16 DIAGNOSIS — R262 Difficulty in walking, not elsewhere classified: Secondary | ICD-10-CM | POA: Diagnosis not present

## 2019-10-22 DIAGNOSIS — K080 Exfoliation of teeth due to systemic causes: Secondary | ICD-10-CM | POA: Diagnosis not present

## 2019-11-02 ENCOUNTER — Ambulatory Visit (HOSPITAL_COMMUNITY)
Admission: RE | Admit: 2019-11-02 | Discharge: 2019-11-02 | Disposition: A | Discharge status: Home / Self Care | Payer: Federal, State, Local not specified - PPO | Source: Ambulatory Visit | Attending: Physician Assistant | Admitting: Physician Assistant

## 2019-11-02 ENCOUNTER — Other Ambulatory Visit: Payer: Self-pay | Admitting: Physician Assistant

## 2019-11-02 DIAGNOSIS — M79604 Pain in right leg: Secondary | ICD-10-CM

## 2019-11-02 DIAGNOSIS — M25561 Pain in right knee: Secondary | ICD-10-CM | POA: Diagnosis not present

## 2019-11-02 NOTE — Progress Notes (Signed)
VASCULAR LAB PRELIMINARY  PRELIMINARY  PRELIMINARY  PRELIMINARY  Right lower extremity venous duplex completed.    Preliminary report:  See CV proc for preliminary results.  Called Kirsten with results.  Stana Bayon, RVT 11/02/2019, 1:33 PM

## 2019-11-02 NOTE — Progress Notes (Signed)
Patient presents in urgent care clinic today with increased calf pain and swelling.  History of a knee scope 2 months ago   History of a DVT as a teenager.   Injected knee today to treat possible ruptured baker's cyst

## 2019-12-12 DIAGNOSIS — R590 Localized enlarged lymph nodes: Secondary | ICD-10-CM | POA: Diagnosis not present

## 2019-12-12 DIAGNOSIS — Z125 Encounter for screening for malignant neoplasm of prostate: Secondary | ICD-10-CM | POA: Diagnosis not present

## 2019-12-17 DIAGNOSIS — K5730 Diverticulosis of large intestine without perforation or abscess without bleeding: Secondary | ICD-10-CM | POA: Diagnosis not present

## 2019-12-17 DIAGNOSIS — R590 Localized enlarged lymph nodes: Secondary | ICD-10-CM | POA: Diagnosis not present

## 2019-12-17 DIAGNOSIS — R599 Enlarged lymph nodes, unspecified: Secondary | ICD-10-CM | POA: Diagnosis not present

## 2019-12-23 DIAGNOSIS — Z125 Encounter for screening for malignant neoplasm of prostate: Secondary | ICD-10-CM | POA: Diagnosis not present

## 2019-12-23 DIAGNOSIS — R590 Localized enlarged lymph nodes: Secondary | ICD-10-CM | POA: Diagnosis not present

## 2019-12-23 DIAGNOSIS — N201 Calculus of ureter: Secondary | ICD-10-CM | POA: Diagnosis not present

## 2020-01-15 DIAGNOSIS — I10 Essential (primary) hypertension: Secondary | ICD-10-CM | POA: Diagnosis not present

## 2020-01-15 DIAGNOSIS — E7800 Pure hypercholesterolemia, unspecified: Secondary | ICD-10-CM | POA: Diagnosis not present

## 2020-09-21 DIAGNOSIS — Z125 Encounter for screening for malignant neoplasm of prostate: Secondary | ICD-10-CM | POA: Diagnosis not present

## 2020-09-21 DIAGNOSIS — E7800 Pure hypercholesterolemia, unspecified: Secondary | ICD-10-CM | POA: Diagnosis not present

## 2020-09-21 DIAGNOSIS — Z0000 Encounter for general adult medical examination without abnormal findings: Secondary | ICD-10-CM | POA: Diagnosis not present

## 2020-09-21 DIAGNOSIS — Z23 Encounter for immunization: Secondary | ICD-10-CM | POA: Diagnosis not present

## 2020-09-21 DIAGNOSIS — I10 Essential (primary) hypertension: Secondary | ICD-10-CM | POA: Diagnosis not present

## 2020-12-18 DIAGNOSIS — R590 Localized enlarged lymph nodes: Secondary | ICD-10-CM | POA: Diagnosis not present

## 2020-12-18 DIAGNOSIS — Z125 Encounter for screening for malignant neoplasm of prostate: Secondary | ICD-10-CM | POA: Diagnosis not present

## 2020-12-24 DIAGNOSIS — Z87442 Personal history of urinary calculi: Secondary | ICD-10-CM | POA: Diagnosis not present

## 2020-12-24 DIAGNOSIS — K4020 Bilateral inguinal hernia, without obstruction or gangrene, not specified as recurrent: Secondary | ICD-10-CM | POA: Diagnosis not present

## 2020-12-24 DIAGNOSIS — K5730 Diverticulosis of large intestine without perforation or abscess without bleeding: Secondary | ICD-10-CM | POA: Diagnosis not present

## 2020-12-24 DIAGNOSIS — K429 Umbilical hernia without obstruction or gangrene: Secondary | ICD-10-CM | POA: Diagnosis not present

## 2020-12-24 DIAGNOSIS — N201 Calculus of ureter: Secondary | ICD-10-CM | POA: Diagnosis not present

## 2020-12-25 DIAGNOSIS — N201 Calculus of ureter: Secondary | ICD-10-CM | POA: Diagnosis not present

## 2020-12-25 DIAGNOSIS — R590 Localized enlarged lymph nodes: Secondary | ICD-10-CM | POA: Diagnosis not present

## 2021-01-21 DIAGNOSIS — M25512 Pain in left shoulder: Secondary | ICD-10-CM | POA: Diagnosis not present

## 2021-01-27 DIAGNOSIS — M25612 Stiffness of left shoulder, not elsewhere classified: Secondary | ICD-10-CM | POA: Diagnosis not present

## 2021-01-27 DIAGNOSIS — M6281 Muscle weakness (generalized): Secondary | ICD-10-CM | POA: Diagnosis not present

## 2021-01-27 DIAGNOSIS — M25512 Pain in left shoulder: Secondary | ICD-10-CM | POA: Diagnosis not present

## 2021-02-08 DIAGNOSIS — M25512 Pain in left shoulder: Secondary | ICD-10-CM | POA: Diagnosis not present

## 2021-02-12 DIAGNOSIS — M25512 Pain in left shoulder: Secondary | ICD-10-CM | POA: Diagnosis not present

## 2021-02-15 DIAGNOSIS — M25512 Pain in left shoulder: Secondary | ICD-10-CM | POA: Diagnosis not present

## 2021-02-18 DIAGNOSIS — M75112 Incomplete rotator cuff tear or rupture of left shoulder, not specified as traumatic: Secondary | ICD-10-CM | POA: Diagnosis not present

## 2021-02-18 DIAGNOSIS — M25612 Stiffness of left shoulder, not elsewhere classified: Secondary | ICD-10-CM | POA: Diagnosis not present

## 2021-02-18 DIAGNOSIS — M6281 Muscle weakness (generalized): Secondary | ICD-10-CM | POA: Diagnosis not present

## 2021-02-18 DIAGNOSIS — M25512 Pain in left shoulder: Secondary | ICD-10-CM | POA: Diagnosis not present

## 2021-02-23 DIAGNOSIS — M75112 Incomplete rotator cuff tear or rupture of left shoulder, not specified as traumatic: Secondary | ICD-10-CM | POA: Diagnosis not present

## 2021-02-23 DIAGNOSIS — M25512 Pain in left shoulder: Secondary | ICD-10-CM | POA: Diagnosis not present

## 2021-02-23 DIAGNOSIS — M25612 Stiffness of left shoulder, not elsewhere classified: Secondary | ICD-10-CM | POA: Diagnosis not present

## 2021-02-23 DIAGNOSIS — M6281 Muscle weakness (generalized): Secondary | ICD-10-CM | POA: Diagnosis not present

## 2021-02-25 DIAGNOSIS — M25612 Stiffness of left shoulder, not elsewhere classified: Secondary | ICD-10-CM | POA: Diagnosis not present

## 2021-02-25 DIAGNOSIS — M25512 Pain in left shoulder: Secondary | ICD-10-CM | POA: Diagnosis not present

## 2021-02-25 DIAGNOSIS — M75112 Incomplete rotator cuff tear or rupture of left shoulder, not specified as traumatic: Secondary | ICD-10-CM | POA: Diagnosis not present

## 2021-02-25 DIAGNOSIS — M6281 Muscle weakness (generalized): Secondary | ICD-10-CM | POA: Diagnosis not present

## 2021-03-02 DIAGNOSIS — M25512 Pain in left shoulder: Secondary | ICD-10-CM | POA: Diagnosis not present

## 2021-03-02 DIAGNOSIS — M6281 Muscle weakness (generalized): Secondary | ICD-10-CM | POA: Diagnosis not present

## 2021-03-02 DIAGNOSIS — M25612 Stiffness of left shoulder, not elsewhere classified: Secondary | ICD-10-CM | POA: Diagnosis not present

## 2021-03-02 DIAGNOSIS — M75112 Incomplete rotator cuff tear or rupture of left shoulder, not specified as traumatic: Secondary | ICD-10-CM | POA: Diagnosis not present

## 2021-03-04 DIAGNOSIS — M6281 Muscle weakness (generalized): Secondary | ICD-10-CM | POA: Diagnosis not present

## 2021-03-04 DIAGNOSIS — M75112 Incomplete rotator cuff tear or rupture of left shoulder, not specified as traumatic: Secondary | ICD-10-CM | POA: Diagnosis not present

## 2021-03-04 DIAGNOSIS — M25512 Pain in left shoulder: Secondary | ICD-10-CM | POA: Diagnosis not present

## 2021-03-04 DIAGNOSIS — M25612 Stiffness of left shoulder, not elsewhere classified: Secondary | ICD-10-CM | POA: Diagnosis not present

## 2021-03-08 DIAGNOSIS — M25512 Pain in left shoulder: Secondary | ICD-10-CM | POA: Diagnosis not present

## 2021-03-09 DIAGNOSIS — M6281 Muscle weakness (generalized): Secondary | ICD-10-CM | POA: Diagnosis not present

## 2021-03-09 DIAGNOSIS — M25512 Pain in left shoulder: Secondary | ICD-10-CM | POA: Diagnosis not present

## 2021-03-09 DIAGNOSIS — M25612 Stiffness of left shoulder, not elsewhere classified: Secondary | ICD-10-CM | POA: Diagnosis not present

## 2021-03-09 DIAGNOSIS — M75112 Incomplete rotator cuff tear or rupture of left shoulder, not specified as traumatic: Secondary | ICD-10-CM | POA: Diagnosis not present

## 2021-03-11 DIAGNOSIS — M6281 Muscle weakness (generalized): Secondary | ICD-10-CM | POA: Diagnosis not present

## 2021-03-11 DIAGNOSIS — M25512 Pain in left shoulder: Secondary | ICD-10-CM | POA: Diagnosis not present

## 2021-03-11 DIAGNOSIS — M75112 Incomplete rotator cuff tear or rupture of left shoulder, not specified as traumatic: Secondary | ICD-10-CM | POA: Diagnosis not present

## 2021-03-11 DIAGNOSIS — M25612 Stiffness of left shoulder, not elsewhere classified: Secondary | ICD-10-CM | POA: Diagnosis not present

## 2021-03-16 DIAGNOSIS — M25612 Stiffness of left shoulder, not elsewhere classified: Secondary | ICD-10-CM | POA: Diagnosis not present

## 2021-03-16 DIAGNOSIS — M75112 Incomplete rotator cuff tear or rupture of left shoulder, not specified as traumatic: Secondary | ICD-10-CM | POA: Diagnosis not present

## 2021-03-16 DIAGNOSIS — M25512 Pain in left shoulder: Secondary | ICD-10-CM | POA: Diagnosis not present

## 2021-03-16 DIAGNOSIS — M6281 Muscle weakness (generalized): Secondary | ICD-10-CM | POA: Diagnosis not present

## 2021-03-17 DIAGNOSIS — M6281 Muscle weakness (generalized): Secondary | ICD-10-CM | POA: Diagnosis not present

## 2021-03-17 DIAGNOSIS — M25612 Stiffness of left shoulder, not elsewhere classified: Secondary | ICD-10-CM | POA: Diagnosis not present

## 2021-03-17 DIAGNOSIS — M25512 Pain in left shoulder: Secondary | ICD-10-CM | POA: Diagnosis not present

## 2021-03-17 DIAGNOSIS — M75112 Incomplete rotator cuff tear or rupture of left shoulder, not specified as traumatic: Secondary | ICD-10-CM | POA: Diagnosis not present

## 2021-03-22 DIAGNOSIS — M25512 Pain in left shoulder: Secondary | ICD-10-CM | POA: Diagnosis not present

## 2021-04-02 DIAGNOSIS — I10 Essential (primary) hypertension: Secondary | ICD-10-CM | POA: Diagnosis not present

## 2021-04-02 DIAGNOSIS — Z23 Encounter for immunization: Secondary | ICD-10-CM | POA: Diagnosis not present

## 2021-04-02 DIAGNOSIS — E7800 Pure hypercholesterolemia, unspecified: Secondary | ICD-10-CM | POA: Diagnosis not present

## 2022-02-12 DIAGNOSIS — J019 Acute sinusitis, unspecified: Secondary | ICD-10-CM | POA: Diagnosis not present

## 2022-02-18 DIAGNOSIS — Z23 Encounter for immunization: Secondary | ICD-10-CM | POA: Diagnosis not present

## 2022-02-18 DIAGNOSIS — Z0000 Encounter for general adult medical examination without abnormal findings: Secondary | ICD-10-CM | POA: Diagnosis not present

## 2022-02-18 DIAGNOSIS — E7800 Pure hypercholesterolemia, unspecified: Secondary | ICD-10-CM | POA: Diagnosis not present

## 2022-02-18 DIAGNOSIS — Z125 Encounter for screening for malignant neoplasm of prostate: Secondary | ICD-10-CM | POA: Diagnosis not present

## 2022-02-18 DIAGNOSIS — I10 Essential (primary) hypertension: Secondary | ICD-10-CM | POA: Diagnosis not present

## 2022-10-17 DIAGNOSIS — K5900 Constipation, unspecified: Secondary | ICD-10-CM | POA: Diagnosis not present

## 2022-10-17 DIAGNOSIS — I10 Essential (primary) hypertension: Secondary | ICD-10-CM | POA: Diagnosis not present

## 2022-10-17 DIAGNOSIS — E7800 Pure hypercholesterolemia, unspecified: Secondary | ICD-10-CM | POA: Diagnosis not present

## 2022-10-17 DIAGNOSIS — E669 Obesity, unspecified: Secondary | ICD-10-CM | POA: Diagnosis not present

## 2022-10-17 DIAGNOSIS — M549 Dorsalgia, unspecified: Secondary | ICD-10-CM | POA: Diagnosis not present

## 2023-05-03 DIAGNOSIS — I10 Essential (primary) hypertension: Secondary | ICD-10-CM | POA: Diagnosis not present

## 2023-05-03 DIAGNOSIS — Z0000 Encounter for general adult medical examination without abnormal findings: Secondary | ICD-10-CM | POA: Diagnosis not present

## 2023-05-03 DIAGNOSIS — Z125 Encounter for screening for malignant neoplasm of prostate: Secondary | ICD-10-CM | POA: Diagnosis not present

## 2023-05-03 DIAGNOSIS — E7800 Pure hypercholesterolemia, unspecified: Secondary | ICD-10-CM | POA: Diagnosis not present

## 2023-11-09 DIAGNOSIS — E7800 Pure hypercholesterolemia, unspecified: Secondary | ICD-10-CM | POA: Diagnosis not present

## 2023-11-09 DIAGNOSIS — I10 Essential (primary) hypertension: Secondary | ICD-10-CM | POA: Diagnosis not present

## 2023-11-13 DIAGNOSIS — E669 Obesity, unspecified: Secondary | ICD-10-CM | POA: Diagnosis not present

## 2023-11-13 DIAGNOSIS — I10 Essential (primary) hypertension: Secondary | ICD-10-CM | POA: Diagnosis not present

## 2023-11-13 DIAGNOSIS — E7800 Pure hypercholesterolemia, unspecified: Secondary | ICD-10-CM | POA: Diagnosis not present

## 2023-11-13 DIAGNOSIS — G245 Blepharospasm: Secondary | ICD-10-CM | POA: Diagnosis not present

## 2023-11-17 DIAGNOSIS — D171 Benign lipomatous neoplasm of skin and subcutaneous tissue of trunk: Secondary | ICD-10-CM | POA: Diagnosis not present

## 2023-11-17 DIAGNOSIS — R222 Localized swelling, mass and lump, trunk: Secondary | ICD-10-CM | POA: Diagnosis not present

## 2024-08-02 ENCOUNTER — Other Ambulatory Visit (HOSPITAL_BASED_OUTPATIENT_CLINIC_OR_DEPARTMENT_OTHER): Payer: Self-pay | Admitting: Family Medicine

## 2024-08-02 DIAGNOSIS — E7800 Pure hypercholesterolemia, unspecified: Secondary | ICD-10-CM

## 2024-08-23 ENCOUNTER — Ambulatory Visit (HOSPITAL_BASED_OUTPATIENT_CLINIC_OR_DEPARTMENT_OTHER)
Admission: RE | Admit: 2024-08-23 | Discharge: 2024-08-23 | Disposition: A | Discharge status: Home / Self Care | Payer: Self-pay | Source: Ambulatory Visit | Attending: Family Medicine | Admitting: Family Medicine

## 2024-08-23 DIAGNOSIS — E7800 Pure hypercholesterolemia, unspecified: Secondary | ICD-10-CM | POA: Insufficient documentation
